# Patient Record
Sex: Female | Born: 1940 | Race: White | Hispanic: No | Marital: Married
Health system: Southern US, Community
[De-identification: ages and names within clinical notes are randomized; demographics above are authoritative.]

---

## 2009-01-15 ENCOUNTER — Ambulatory Visit: Payer: Self-pay | Admitting: Cardiology

## 2009-01-21 ENCOUNTER — Ambulatory Visit: Payer: Self-pay | Admitting: Cardiology

## 2009-01-21 ENCOUNTER — Inpatient Hospital Stay (HOSPITAL_BASED_OUTPATIENT_CLINIC_OR_DEPARTMENT_OTHER): Admission: RE | Admit: 2009-01-21 | Discharge: 2009-01-21 | Payer: Self-pay | Admitting: Cardiology

## 2010-12-23 LAB — POCT I-STAT GLUCOSE: Operator id: 194801

## 2011-01-27 NOTE — Assessment & Plan Note (Signed)
[facility]                          [facility] OFFICE NOTE   NAME:Amy Collins, Amy Collins                         MRN:          [id number]  DATE:01/15/2009                            DOB:          [birth year]    REFERRING PHYSICIAN:  Alton Revere   REASON FOR CONSULTATION:  Evaluation of abnormal stress test prior to  left knee replacement.   HISTORY OF PRESENT ILLNESS:  The patient is a very pleasant 70 year old  female,  her husband is also a patient of mine.  The patient reportedly  scheduled for a total knee replacement in the left side.  She has had a  prior right knee replacement.  She continues to have difficulty getting  around and has to walk with a walker.  In her preoperative evaluation,  she was found to have an abnormality in EKG consistent of some minor  diffuse ST-segment changes anteriorly and the patient was referred for  stress test.  Unfortunately her stress test was read as abnormal with a  large anterior defect, although not reversible.  The reading physician  could not rule out ischemia.  Ejection fraction was normal at 63%.  The  patient does have large breast tissue and I assume that this is related  to breast attenuation, but we cannot be completely certain about this.  The patient does have multiple risk factors for coronary artery disease  including dyslipidemia, type 2 diabetes mellitus, and hypertension.  There is also a family history of coronary artery disease on her  father's side.  She denies, however, any substernal chest pain, has  sense of fleeting sensation on occasion in her chest, but has no  dyspnea, orthopnea, PND, palpitations, or syncope.  She underwent back  surgery in October 2009 and had no preoperative screening done at that  time and had no cardiac issues.  The patient questioned me whether she  really needed his catheterization and I told her that this is a  borderline call.  I would still classify the  study as borderline given  her large breast tissue.  I discussed with the patient, however, very  carefully the risk and benefit of catheterization and she states that  she certainly willing to proceed and wants to be 100% certain as she  still has to take care of her husband and feels very responsible for her  other family members, who was to remain in good health.   MEDICATIONS:  1. Celebrex 200 mg p.o. b.i.d.  2. Potassium 10 mEq p.o. b.i.d.  3. Vitamin D 1000 mg p.o. daily.  4. Lovaza 1 mg p.o. b.i.d.  5. Cymbalta 60 mg p.o. b.i.d.  6. Quinapril 40 mg p.o. b.i.d.  7. Abilify 5 mg p.o. daily.  8. Verapamil 250 mg p.o. b.i.d.  9. Lipitor 80 mg p.o. daily.  10.Toviaz 8 mg p.o. daily.  11.Evista 60 mg p.o. daily.  12.Actos 30 mg p.o. daily.  13.Starlix 1 tablet a.c. with meals.  14.Zetia 10 mg p.o. daily.  15.Indapamide 1.25 mg p.o. daily.  16.Vitamin B12 250 mg p.o. daily.   PAST  MEDICAL HISTORY:  1. Sleep apnea.  2. Gout.  3. Diabetes mellitus type 2.  4. History of skin ulcers.  5. History of depression.  6. Hypertension.   PAST SURGICAL HISTORY:  1. Carpel tunnel release.  2. Right knee replacement.  3. Spinal decompression in October 2009.   SOCIAL HISTORY:  The patient lives with her husband, who recently  underwent stent placement.  She does not smoke or drink.   FAMILY HISTORY:  Mother died from sepsis pneumonia and had cervical  cancer.  Father died from myocardial infarction, had many years angina.   ALLERGIES:  No known drug allergies.  I specifically questioned the  patient about intravenous dye allergy.   REVIEW OF SYSTEMS:  The patient denies any nausea, vomiting, fever, or  chills.  No melena or hematochezia.  No dysuria or frequency.  No  palpitations or syncope.  No visual changes.  No neurological symptoms.  Otherwise, the review of systems is negative.   PHYSICAL EXAMINATION:  VITAL SIGNS:  Blood pressure 156/73, heart rate  100 beats per  minute, weight 260 pounds, and height 5 feet 3 inches.  GENERAL:  Obese white female in no distress having difficulty walking  with a walker.  HEENT:  Pupils are anisocoric.  Conjunctivae clear.  NECK:  Supple.  Normal carotid upstroke and no carotid bruit.  JVD in a  9 degrees angle is essentially flat.  There is no thyromegaly.  Non-  nodular thyroid.  There is no adenopathy in cervical or supraclavicular  lymph stations.  LUNGS:  Clear bilaterally.  HEART:  Regular rate and rhythm.  Normal S1 and S2.  No murmur, rubs, or  gallops.  ABDOMEN:  Soft, nontender with no rebound or guarding.  Good bowel  sounds.  EXTREMITIES:  No cyanosis or clubbing and there is some nonpitting  edema.  Dorsalis pedis and posterior tibial pulses are within normal  limits in the lower extremities.  NEUROLOGIC:  The patient is alert and oriented, grossly nonfocal.   PROBLEM LIST:  1. Borderline abnormal Cardiolite stress study with possible breast      attenuation, but cannot rule out ischemia.  2. Nonspecific EKG changes.  3. Cardiac risk factors.  4. Pending total knee replacement of the left knee.   PLAN:  1. I appreciate Dr. Harrison Mons referral of this patient.  It is somewhat      of a difficult call whether the patient is absolutely in need of a      cardiac catheterization given the fact that she is asymptomatic,      although clearly a functional status is poor due to her orthopedic      problems.  Cardiolite study is not necessarily high risk because of      the fixed defect and could be related to breast attenuation.      However, given her risk factor profile, I agree with Dr. Darius Bump to      proceed with cardiac catheterization.  The patient is also in favor      of this.  2. We will set up the patient for an outpatient catheterization in the      [facility] cath lab.  She has asked for Dr. Marca Ancona to do the      procedure (he did her husband's procedure) and we will try to      arrange this  for next week.  3. I told the patient if she has any problems in the interim,  we will      be happy to schedule the catheterization earlier, but she is quite      stable from a clinical perspective.     Learta Codding, MD,FACC  Electronically Signed    GED/MedQ  DD: 01/15/2009  DT: 01/16/2009  Job #: [id number]   cc:   Alton Revere

## 2011-01-27 NOTE — Cardiovascular Report (Signed)
Amy Collins, Amy Collins                ACCOUNT NO.:  [id number]   MEDICAL RECORD NO.:  [id number]          PATIENT TYPE:  OIB   LOCATION:  1965                         FACILITY:  [facility]   PHYSICIAN:  Marca Ancona, MD      DATE OF BIRTH:  [birth year]   DATE OF PROCEDURE:  01/21/2009  DATE OF DISCHARGE:                            CARDIAC CATHETERIZATION   PROCEDURES:  1. Left heart catheterization.  2. Coronary angiography.  3. Left ventriculography.   INDICATIONS:  Positive Myoview as a part of preop assessment.   PROCEDURE NOTE:  After informed consent was obtained and the right groin  was sterilely prepped and draped.  Lidocaine 1% was used to locally  anesthetized the right groin area.  The right common femoral artery was  entered using Seldinger technique and a 4-French arterial sheath was  placed.  The left coronary artery was engaged using the 4-French JL-4  catheter.  The right coronary artery was engaged using a 4-French 3DRC  catheter and the left ventricle was entered using the 4-French angled  pigtail catheter.   FINDINGS:  1. Hemodynamics:  LV 151/23, aorta 153/70.  2. Left ventriculography.  EF was 60%.  There were no regional wall      motion abnormalities and no significant mitral regurgitation.  3. Coronary angiography.  There were mild luminal irregularities only      in the LAD, otherwise no angiographic coronary artery disease      throughout the coronary system.  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  The      RCA was a small nondominant vessel.  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   ASSESSMENT AND PLAN:  This is a 70 year old with a fixed anterior defect  found on preop Myoview.  The cause of the fixed anterior defect on preop  Myoview is likely soft tissue attenuation.  There was no obstructive  coronary artery disease.  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  LV systolic function was  normal.  The LVEDP was mildly elevated suggestive of diastolic  dysfunction.      Marca Ancona, MD  Electronically Signed     DM/MEDQ  D:  01/21/2009  T:  01/22/2009  Job:  [id number]   cc:   Learta Codding, MD,FACC

## 2018-03-28 ENCOUNTER — Inpatient Hospital Stay
Admission: AD | Admit: 2018-03-28 | Discharge: 2018-04-08 | Disposition: A | Discharge status: Skilled Nursing Facility | Payer: Self-pay | Source: Ambulatory Visit | Attending: Internal Medicine | Admitting: Internal Medicine

## 2018-03-28 DIAGNOSIS — R609 Edema, unspecified: Secondary | ICD-10-CM

## 2018-03-28 DIAGNOSIS — J9690 Respiratory failure, unspecified, unspecified whether with hypoxia or hypercapnia: Secondary | ICD-10-CM

## 2018-03-28 DIAGNOSIS — R109 Unspecified abdominal pain: Secondary | ICD-10-CM

## 2018-03-28 DIAGNOSIS — J189 Pneumonia, unspecified organism: Secondary | ICD-10-CM

## 2018-03-28 DIAGNOSIS — J90 Pleural effusion, not elsewhere classified: Secondary | ICD-10-CM

## 2018-03-28 LAB — HEMOGLOBIN A1C
Hgb A1c MFr Bld: 6.6 % — ABNORMAL HIGH (ref 4.8–5.6)
Mean Plasma Glucose: 142.72 mg/dL

## 2018-03-29 ENCOUNTER — Other Ambulatory Visit (HOSPITAL_COMMUNITY): Payer: Self-pay

## 2018-03-29 LAB — COMPREHENSIVE METABOLIC PANEL
ALBUMIN: 2.7 g/dL — AB (ref 3.5–5.0)
ALK PHOS: 88 U/L (ref 38–126)
ALT: 16 U/L (ref 0–44)
ANION GAP: 9 (ref 5–15)
AST: 16 U/L (ref 15–41)
BILIRUBIN TOTAL: 0.7 mg/dL (ref 0.3–1.2)
BUN: 8 mg/dL (ref 8–23)
CALCIUM: 8.9 mg/dL (ref 8.9–10.3)
CO2: 35 mmol/L — ABNORMAL HIGH (ref 22–32)
Chloride: 97 mmol/L — ABNORMAL LOW (ref 98–111)
Creatinine, Ser: 0.48 mg/dL (ref 0.44–1.00)
GLUCOSE: 157 mg/dL — AB (ref 70–99)
POTASSIUM: 3.4 mmol/L — AB (ref 3.5–5.1)
Sodium: 141 mmol/L (ref 135–145)
TOTAL PROTEIN: 5.6 g/dL — AB (ref 6.5–8.1)

## 2018-03-29 LAB — CBC WITH DIFFERENTIAL/PLATELET
ABS IMMATURE GRANULOCYTES: 0.4 10*3/uL — AB (ref 0.0–0.1)
BASOS ABS: 0 10*3/uL (ref 0.0–0.1)
BASOS PCT: 0 %
EOS PCT: 4 %
Eosinophils Absolute: 0.4 10*3/uL (ref 0.0–0.7)
HCT: 36.2 % (ref 36.0–46.0)
HEMOGLOBIN: 11.1 g/dL — AB (ref 12.0–15.0)
Immature Granulocytes: 4 %
LYMPHS PCT: 9 %
Lymphs Abs: 1 10*3/uL (ref 0.7–4.0)
MCH: 25.9 pg — ABNORMAL LOW (ref 26.0–34.0)
MCHC: 30.7 g/dL (ref 30.0–36.0)
MCV: 84.4 fL (ref 78.0–100.0)
Monocytes Absolute: 1.2 10*3/uL — ABNORMAL HIGH (ref 0.1–1.0)
Monocytes Relative: 11 %
NEUTROS ABS: 7.9 10*3/uL — AB (ref 1.7–7.7)
Neutrophils Relative %: 72 %
Platelets: 243 10*3/uL (ref 150–400)
RBC: 4.29 MIL/uL (ref 3.87–5.11)
RDW: 16.5 % — ABNORMAL HIGH (ref 11.5–15.5)
WBC: 10.9 10*3/uL — AB (ref 4.0–10.5)

## 2018-03-29 LAB — PROTIME-INR
INR: 1.14
PROTHROMBIN TIME: 14.5 s (ref 11.4–15.2)

## 2018-03-29 LAB — MAGNESIUM: Magnesium: 1.8 mg/dL (ref 1.7–2.4)

## 2018-03-29 LAB — PHOSPHORUS: PHOSPHORUS: 4 mg/dL (ref 2.5–4.6)

## 2018-03-30 ENCOUNTER — Encounter: Payer: Self-pay | Admitting: *Deleted

## 2018-03-30 LAB — POTASSIUM: Potassium: 4.3 mmol/L (ref 3.5–5.1)

## 2018-03-30 NOTE — Congregational Nurse Program (Signed)
[id number]/Casy Brunetto,BSN,RN3,CCM,CN:[phone]/Visited with patient after receiving request for prayer through the [facility] staff. Patient is alone and family is in [place] and unable to visit.  Sister's health and not good and son lives [place]  Currently she lives alone but does have caregivers who come into the home to help.  Prayers of healing ands strength were offered and said.  Patient stated that she appreciated the visit and prayers and would like another visit next week if possible.  Patient was on 02 during this time and seems to have some difficulty speaking other than in short sentences.  I will plan on revisiting her next week if she is still a patient at [facility].

## 2018-04-01 LAB — BASIC METABOLIC PANEL
Anion gap: 12 (ref 5–15)
BUN: 6 mg/dL — AB (ref 8–23)
CO2: 36 mmol/L — ABNORMAL HIGH (ref 22–32)
CREATININE: 0.45 mg/dL (ref 0.44–1.00)
Calcium: 9.2 mg/dL (ref 8.9–10.3)
Chloride: 90 mmol/L — ABNORMAL LOW (ref 98–111)
GFR calc Af Amer: >60 mL/min (ref >60)
Glucose, Bld: 160 mg/dL — ABNORMAL HIGH (ref 70–99)
POTASSIUM: 4 mmol/L (ref 3.5–5.1)
Sodium: 138 mmol/L (ref 135–145)

## 2018-04-01 LAB — MAGNESIUM: MAGNESIUM: 1.7 mg/dL (ref 1.7–2.4)

## 2018-04-02 ENCOUNTER — Other Ambulatory Visit (HOSPITAL_COMMUNITY): Payer: Self-pay

## 2018-04-03 ENCOUNTER — Other Ambulatory Visit (HOSPITAL_COMMUNITY): Payer: Self-pay

## 2018-04-03 LAB — BASIC METABOLIC PANEL
ANION GAP: 14 (ref 5–15)
BUN: 12 mg/dL (ref 8–23)
CALCIUM: 9.2 mg/dL (ref 8.9–10.3)
CHLORIDE: 87 mmol/L — AB (ref 98–111)
CO2: 38 mmol/L — AB (ref 22–32)
Creatinine, Ser: 0.54 mg/dL (ref 0.44–1.00)
GFR calc non Af Amer: >60 mL/min (ref >60)
Glucose, Bld: 153 mg/dL — ABNORMAL HIGH (ref 70–99)
POTASSIUM: 4.2 mmol/L (ref 3.5–5.1)
Sodium: 139 mmol/L (ref 135–145)

## 2018-04-03 LAB — CBC
HEMATOCRIT: 40.9 % (ref 36.0–46.0)
HEMOGLOBIN: 12.2 g/dL (ref 12.0–15.0)
MCH: 25.7 pg — ABNORMAL LOW (ref 26.0–34.0)
MCHC: 29.8 g/dL — ABNORMAL LOW (ref 30.0–36.0)
MCV: 86.3 fL (ref 78.0–100.0)
Platelets: 221 10*3/uL (ref 150–400)
RBC: 4.74 MIL/uL (ref 3.87–5.11)
RDW: 15.9 % — ABNORMAL HIGH (ref 11.5–15.5)
WBC: 9.1 10*3/uL (ref 4.0–10.5)

## 2018-04-03 LAB — BLOOD GAS, ARTERIAL
Acid-Base Excess: 15.9 mmol/L — ABNORMAL HIGH (ref 0.0–2.0)
Bicarbonate: 40.8 mmol/L — ABNORMAL HIGH (ref 20.0–28.0)
O2 Content: 3 L/min
O2 Saturation: 97.4 %
PATIENT TEMPERATURE: 98.6
pCO2 arterial: 56.4 mmHg — ABNORMAL HIGH (ref 32.0–48.0)
pH, Arterial: 7.472 — ABNORMAL HIGH (ref 7.350–7.450)
pO2, Arterial: 87.5 mmHg (ref 83.0–108.0)

## 2018-04-03 LAB — MAGNESIUM: Magnesium: 1.9 mg/dL (ref 1.7–2.4)

## 2018-04-05 ENCOUNTER — Other Ambulatory Visit (HOSPITAL_COMMUNITY): Payer: Self-pay

## 2018-04-05 LAB — BASIC METABOLIC PANEL
ANION GAP: 11 (ref 5–15)
BUN: 12 mg/dL (ref 8–23)
CHLORIDE: 91 mmol/L — AB (ref 98–111)
CO2: 38 mmol/L — AB (ref 22–32)
Calcium: 8.9 mg/dL (ref 8.9–10.3)
Creatinine, Ser: 0.62 mg/dL (ref 0.44–1.00)
GFR calc Af Amer: >60 mL/min (ref >60)
GLUCOSE: 134 mg/dL — AB (ref 70–99)
POTASSIUM: 3.6 mmol/L (ref 3.5–5.1)
Sodium: 140 mmol/L (ref 135–145)

## 2018-04-05 LAB — CBC
HCT: 37.2 % (ref 36.0–46.0)
HEMOGLOBIN: 11.2 g/dL — AB (ref 12.0–15.0)
MCH: 25.7 pg — AB (ref 26.0–34.0)
MCHC: 30.1 g/dL (ref 30.0–36.0)
MCV: 85.3 fL (ref 78.0–100.0)
PLATELETS: 227 10*3/uL (ref 150–400)
RBC: 4.36 MIL/uL (ref 3.87–5.11)
RDW: 16.2 % — ABNORMAL HIGH (ref 11.5–15.5)
WBC: 6.4 10*3/uL (ref 4.0–10.5)

## 2018-04-05 LAB — MAGNESIUM: Magnesium: 2 mg/dL (ref 1.7–2.4)

## 2018-04-05 LAB — PHOSPHORUS: Phosphorus: 3.7 mg/dL (ref 2.5–4.6)

## 2018-04-28 ENCOUNTER — Other Ambulatory Visit: Payer: Self-pay

## 2018-04-29 ENCOUNTER — Ambulatory Visit: Payer: Medicare Other | Admitting: Podiatry

## 2018-10-18 ENCOUNTER — Telehealth: Payer: Self-pay

## 2018-10-27 NOTE — Telephone Encounter (Signed)
Opened in error; Disregard.

## 2019-07-27 IMAGING — CR DG CHEST 1V PORT
1 series · 1 of 1 positions shown · non-contrast
Comparison: No recent prior.

CLINICAL DATA: Cough.

EXAM:
PORTABLE CHEST 1 VIEW

[AP]
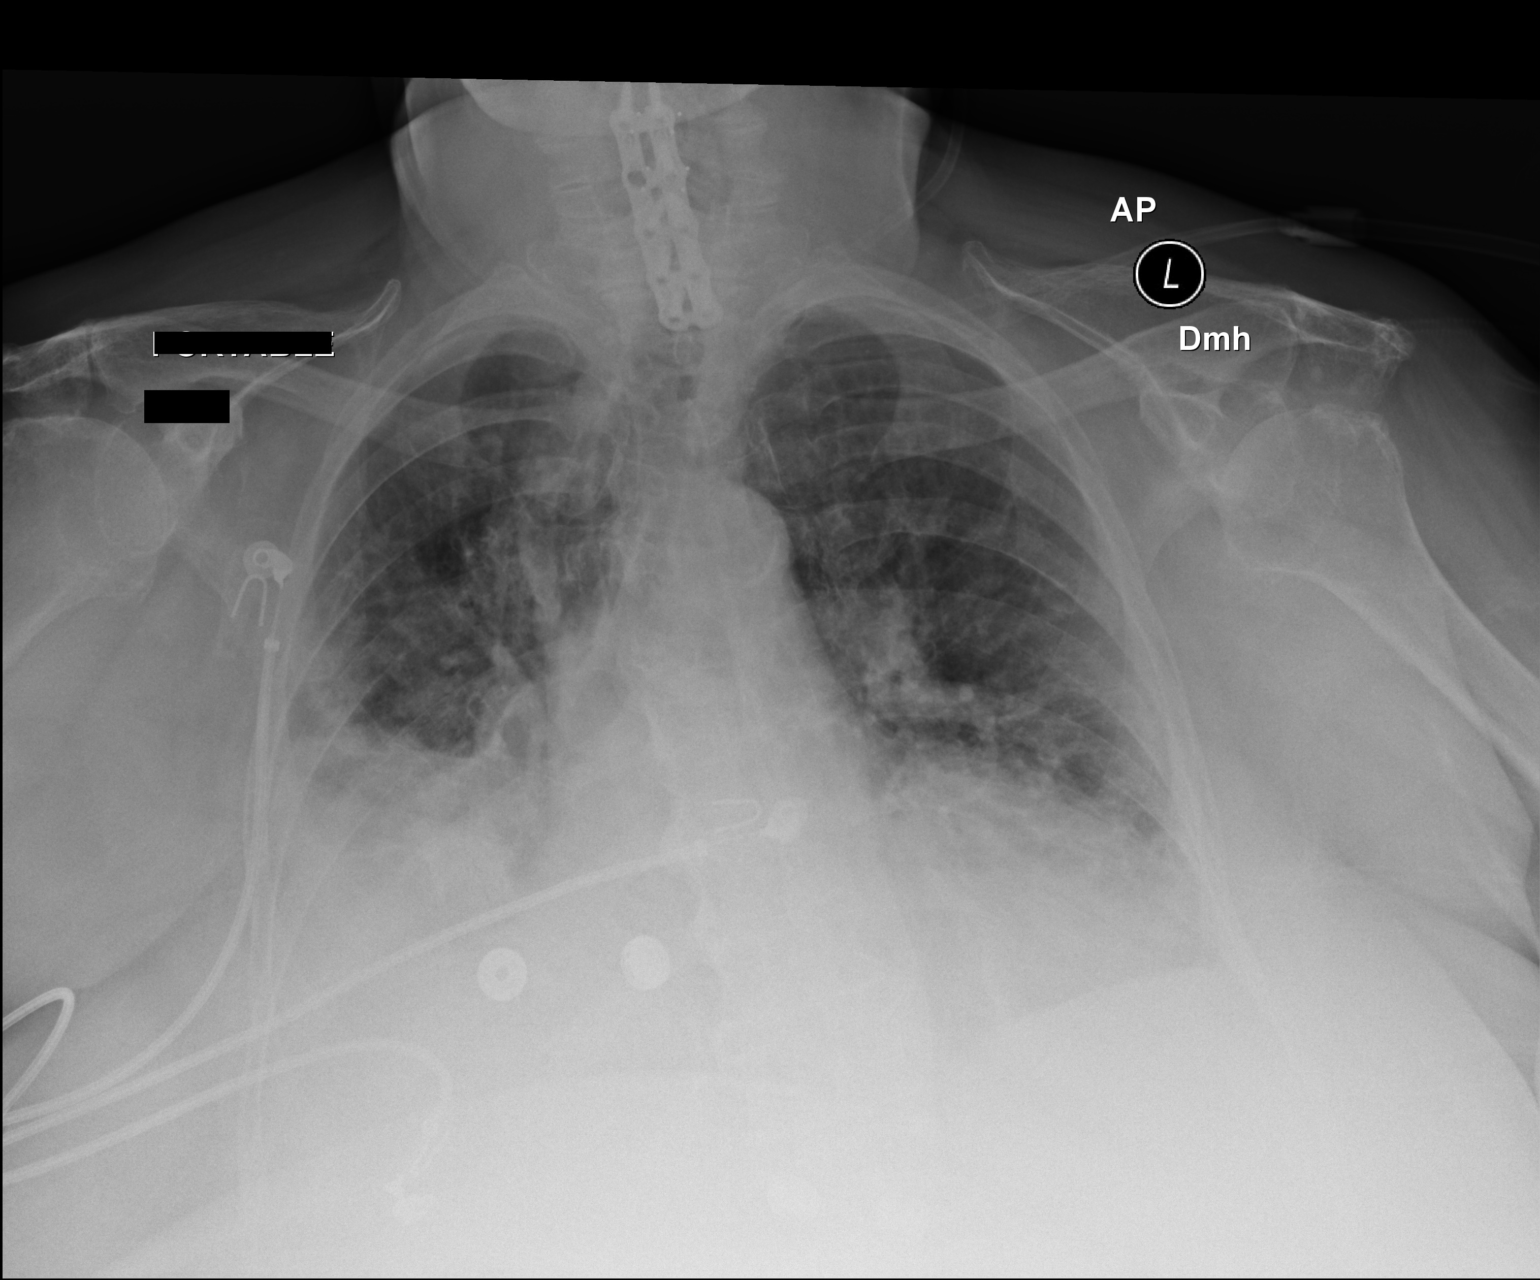

[1 of 1 positions shown; findings below may reference images not displayed]

FINDINGS: Mediastinum appears normal. Cardiomegaly with pulmonary vascular
prominence. Bibasilar infiltrates/edema. Bibasilar atelectasis.
Bilateral pleural effusions, right side greater than left. CHF could
present in this fashion. Bibasilar pneumonia may be present. No
pneumothorax. Prior cervicothoracic spine fusion. Degenerative
change cervicothoracic spine.
IMPRESSION: Cardiomegaly with pulmonary vascular prominence. Bibasilar
infiltrates/edema bibasilar atelectasis. Bilateral pleural
effusions, right side greater than left. CHF could present this
fashion asks good bibasilar pneumonia.

## 2020-04-18 IMAGING — US US ABDOMEN LIMITED
1 series · 14 of 25 positions shown · non-contrast
Comparison: None.

CLINICAL DATA: Abdominal pain.

EXAM:
ULTRASOUND ABDOMEN LIMITED RIGHT UPPER QUADRANT

[Series 1: us abdomen limited · 0.21mm/px · 14 of 30 slices shown]
[im 1/30]
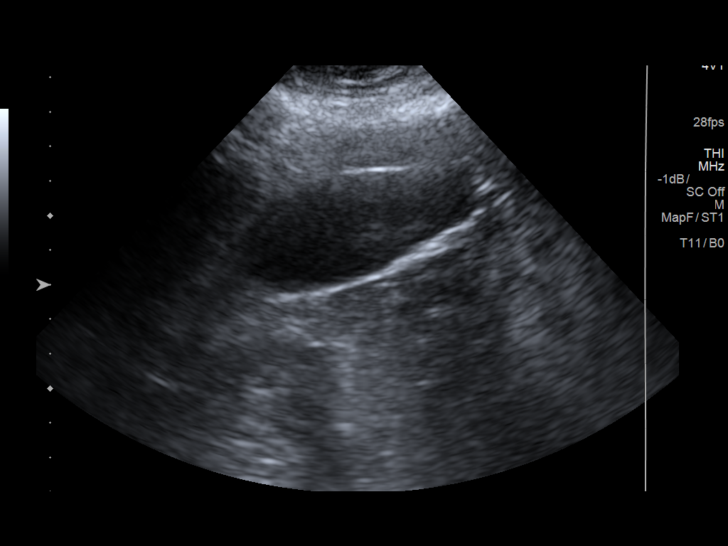
[im 3/30]
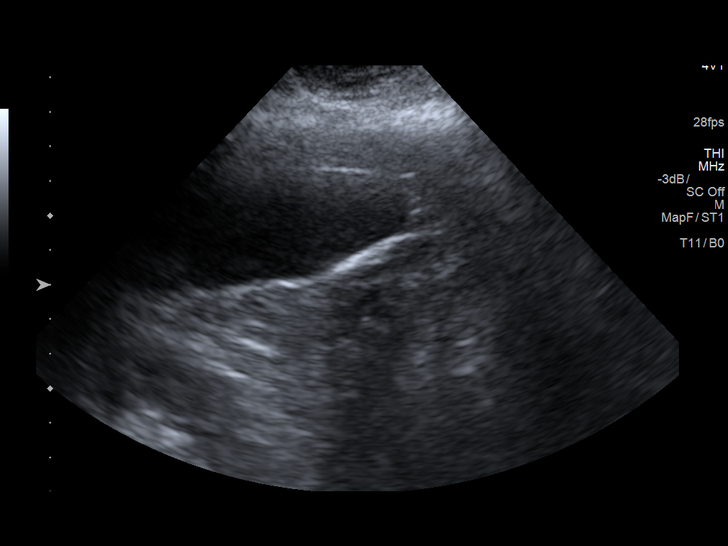
[im 5/30]
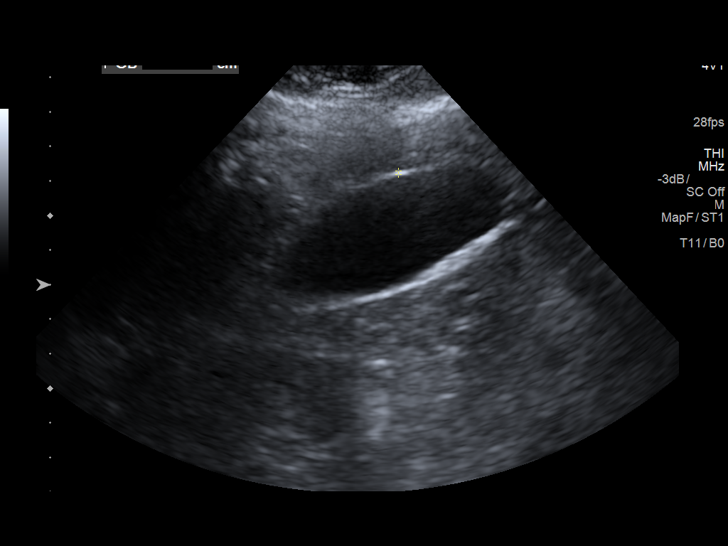
[im 8/30]
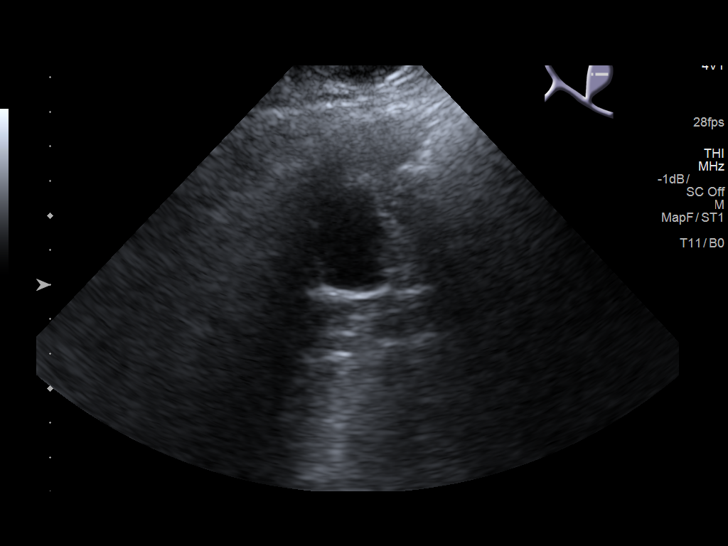
[im 10/30]
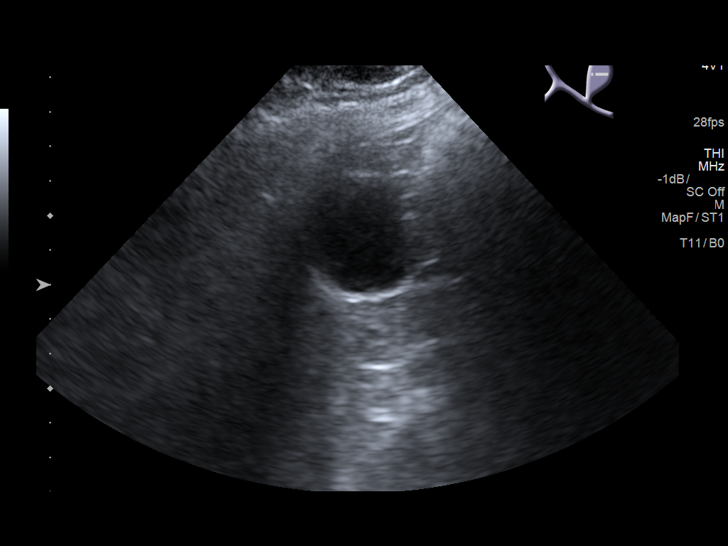
[im 11/30]
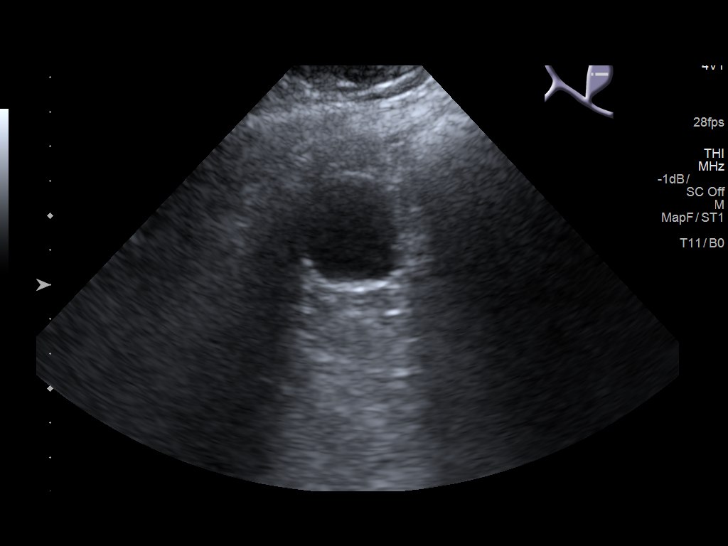
[im 14/30]
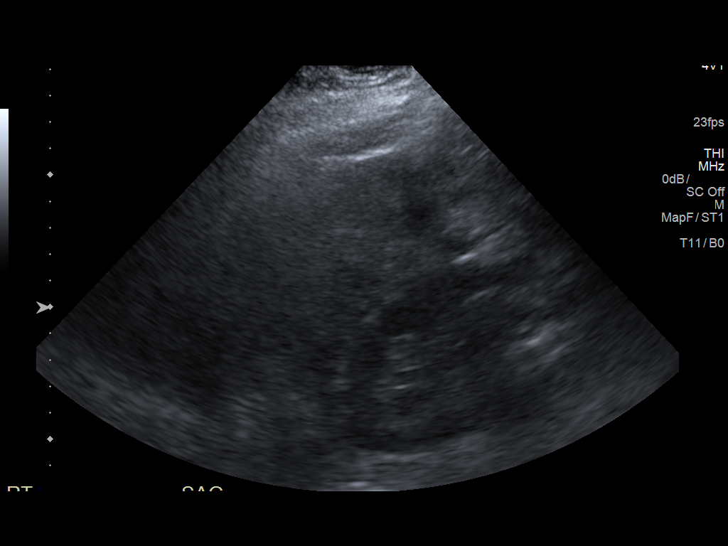
[im 16/30]
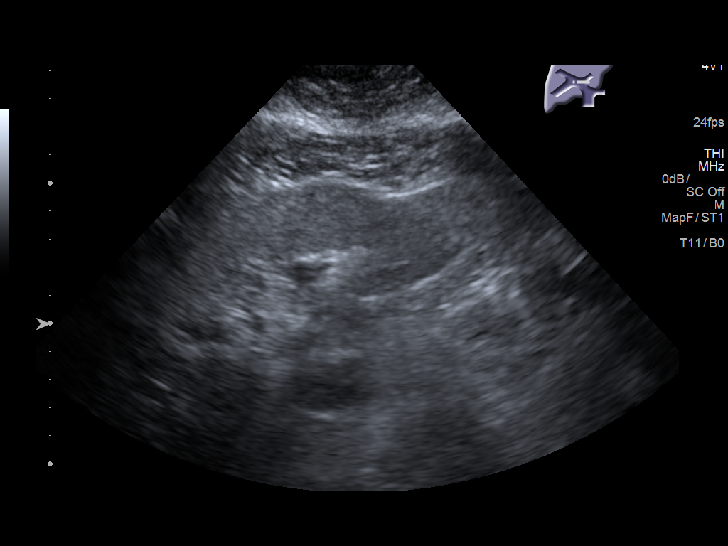
[im 19/30]
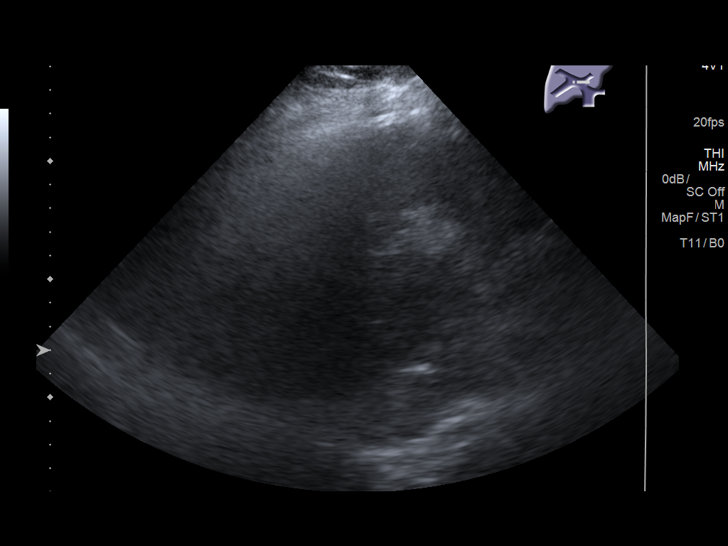
[im 20/30]
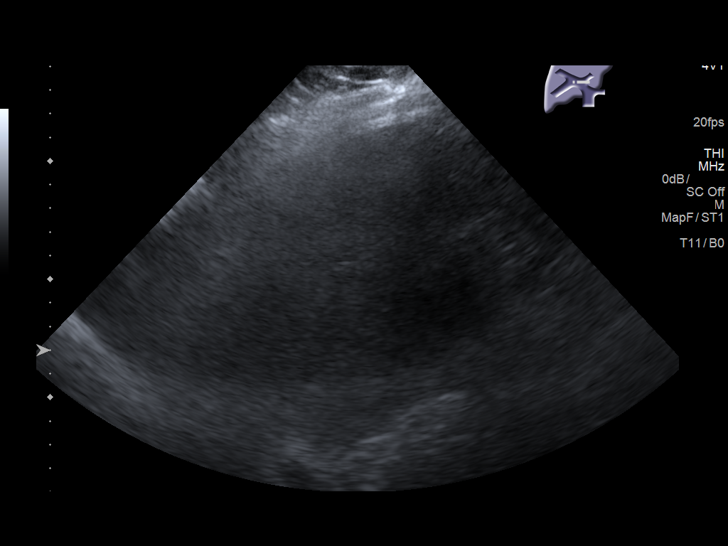
[im 22/30]
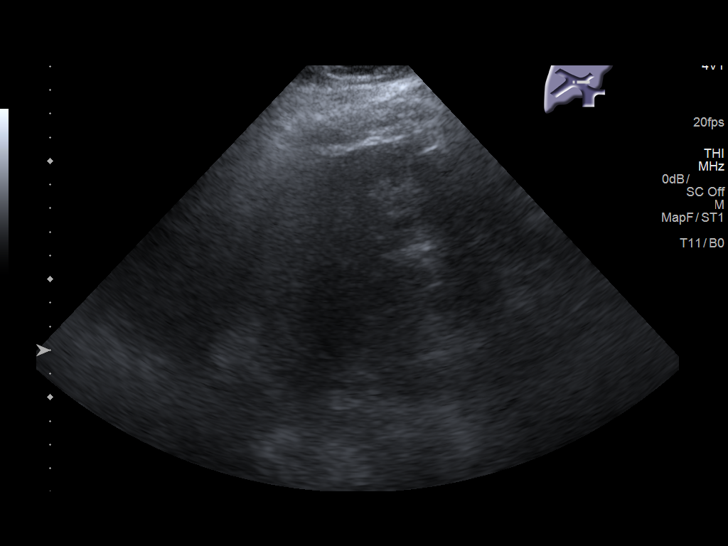
[im 25/30]
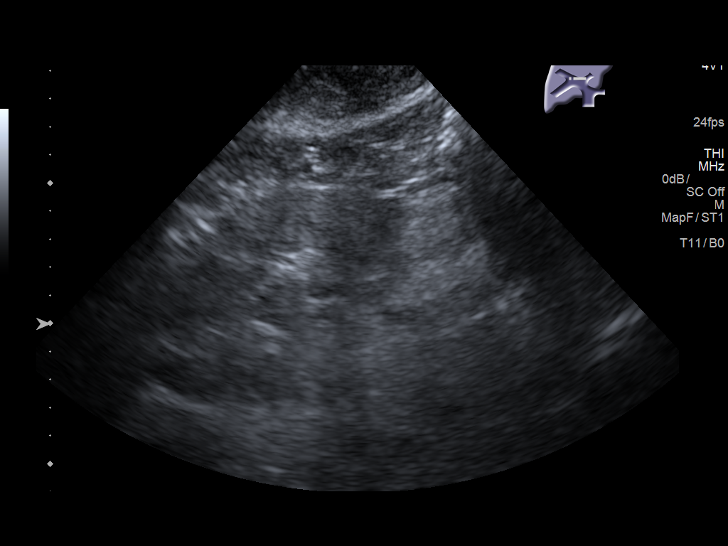
[im 27/30]
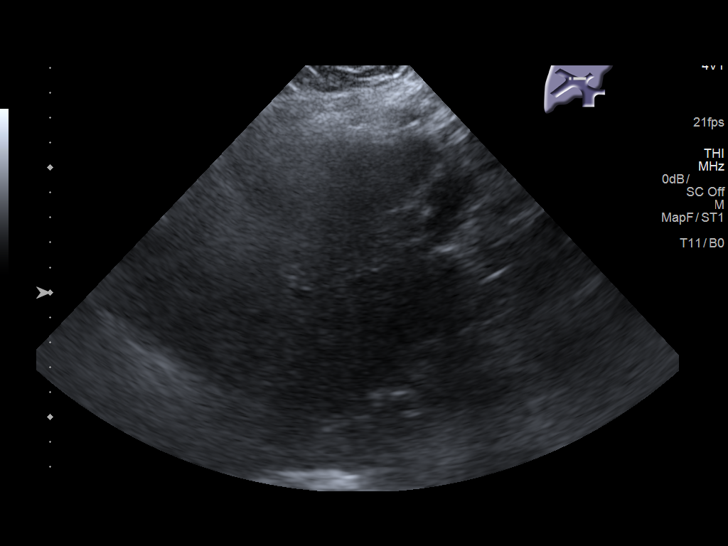
[im 30/30]
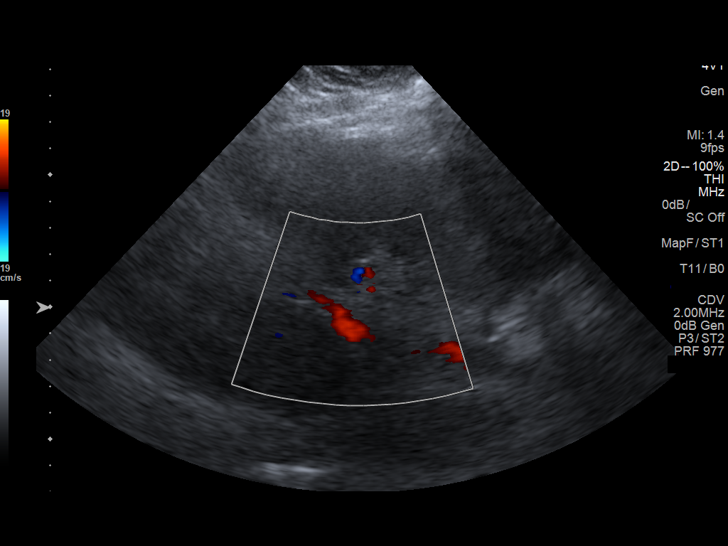

[14 of 25 positions shown; findings below may reference images not displayed]

FINDINGS: Exam limited due to patient body habitus.  Patient uncooperative.

Gallbladder:

No gallstones or wall thickening visualized. No sonographic Murphy
sign noted by sonographer.

Common bile duct:

Diameter: 4.2 mm.

Liver:

Coarse parenchymal echogenicity without focal mass likely due to
steatosis. Portal vein is patent on color Doppler imaging with
normal direction of blood flow towards the liver.
IMPRESSION: No acute findings.

Suggestion of attic steatosis without focal mass.

## 2024-04-14 DEATH — deceased
